# Patient Record
Sex: Male | Born: 1960
Health system: Southern US, Community
[De-identification: ages and names within clinical notes are randomized; demographics above are authoritative.]

## PROBLEM LIST (undated history)

## (undated) DIAGNOSIS — F1410 Cocaine abuse, uncomplicated: Secondary | ICD-10-CM

## (undated) DIAGNOSIS — I10 Essential (primary) hypertension: Secondary | ICD-10-CM

## (undated) DIAGNOSIS — F1010 Alcohol abuse, uncomplicated: Secondary | ICD-10-CM

---

## 2015-04-09 ENCOUNTER — Emergency Department (HOSPITAL_COMMUNITY): Payer: Medicaid Other

## 2015-04-09 ENCOUNTER — Inpatient Hospital Stay (HOSPITAL_COMMUNITY): Payer: Medicaid Other

## 2015-04-09 ENCOUNTER — Inpatient Hospital Stay (HOSPITAL_COMMUNITY)
Admission: EM | Admit: 2015-04-09 | Discharge: 2015-04-23 | DRG: 963 | Disposition: E | Payer: Medicaid Other | Attending: General Surgery | Admitting: General Surgery

## 2015-04-09 ENCOUNTER — Encounter (HOSPITAL_COMMUNITY): Payer: Self-pay | Admitting: Emergency Medicine

## 2015-04-09 DIAGNOSIS — S0219XB Other fracture of base of skull, initial encounter for open fracture: Secondary | ICD-10-CM | POA: Diagnosis present

## 2015-04-09 DIAGNOSIS — N179 Acute kidney failure, unspecified: Secondary | ICD-10-CM | POA: Diagnosis present

## 2015-04-09 DIAGNOSIS — S7292XB Unspecified fracture of left femur, initial encounter for open fracture type I or II: Secondary | ICD-10-CM | POA: Diagnosis present

## 2015-04-09 DIAGNOSIS — S069X9A Unspecified intracranial injury with loss of consciousness of unspecified duration, initial encounter: Secondary | ICD-10-CM | POA: Diagnosis present

## 2015-04-09 DIAGNOSIS — R402212 Coma scale, best verbal response, none, at arrival to emergency department: Secondary | ICD-10-CM | POA: Diagnosis present

## 2015-04-09 DIAGNOSIS — R402112 Coma scale, eyes open, never, at arrival to emergency department: Secondary | ICD-10-CM | POA: Diagnosis present

## 2015-04-09 DIAGNOSIS — R402312 Coma scale, best motor response, none, at arrival to emergency department: Secondary | ICD-10-CM | POA: Diagnosis present

## 2015-04-09 DIAGNOSIS — S069XAA Unspecified intracranial injury with loss of consciousness status unknown, initial encounter: Secondary | ICD-10-CM | POA: Diagnosis present

## 2015-04-09 DIAGNOSIS — S7291XB Unspecified fracture of right femur, initial encounter for open fracture type I or II: Secondary | ICD-10-CM | POA: Diagnosis present

## 2015-04-09 DIAGNOSIS — Z66 Do not resuscitate: Secondary | ICD-10-CM | POA: Diagnosis not present

## 2015-04-09 DIAGNOSIS — R001 Bradycardia, unspecified: Secondary | ICD-10-CM

## 2015-04-09 DIAGNOSIS — T1490XA Injury, unspecified, initial encounter: Secondary | ICD-10-CM

## 2015-04-09 DIAGNOSIS — I959 Hypotension, unspecified: Secondary | ICD-10-CM | POA: Diagnosis present

## 2015-04-09 DIAGNOSIS — S0291XB Unspecified fracture of skull, initial encounter for open fracture: Secondary | ICD-10-CM | POA: Diagnosis present

## 2015-04-09 DIAGNOSIS — J9600 Acute respiratory failure, unspecified whether with hypoxia or hypercapnia: Secondary | ICD-10-CM | POA: Diagnosis present

## 2015-04-09 DIAGNOSIS — S82201B Unspecified fracture of shaft of right tibia, initial encounter for open fracture type I or II: Secondary | ICD-10-CM | POA: Diagnosis present

## 2015-04-09 DIAGNOSIS — I10 Essential (primary) hypertension: Secondary | ICD-10-CM | POA: Diagnosis present

## 2015-04-09 DIAGNOSIS — S82401B Unspecified fracture of shaft of right fibula, initial encounter for open fracture type I or II: Secondary | ICD-10-CM

## 2015-04-09 DIAGNOSIS — S0292XA Unspecified fracture of facial bones, initial encounter for closed fracture: Secondary | ICD-10-CM | POA: Diagnosis present

## 2015-04-09 DIAGNOSIS — I4901 Ventricular fibrillation: Secondary | ICD-10-CM | POA: Diagnosis not present

## 2015-04-09 DIAGNOSIS — T07XXXA Unspecified multiple injuries, initial encounter: Secondary | ICD-10-CM

## 2015-04-09 DIAGNOSIS — S0181XA Laceration without foreign body of other part of head, initial encounter: Secondary | ICD-10-CM | POA: Diagnosis present

## 2015-04-09 HISTORY — DX: Cocaine abuse, uncomplicated: F14.10

## 2015-04-09 HISTORY — DX: Alcohol abuse, uncomplicated: F10.10

## 2015-04-09 HISTORY — DX: Essential (primary) hypertension: I10

## 2015-04-09 LAB — COMPREHENSIVE METABOLIC PANEL
ALT: 45 U/L (ref 17–63)
AST: 104 U/L — ABNORMAL HIGH (ref 15–41)
Albumin: 2.2 g/dL — ABNORMAL LOW (ref 3.5–5.0)
Alkaline Phosphatase: 64 U/L (ref 38–126)
Anion gap: 13 (ref 5–15)
BILIRUBIN TOTAL: 0.4 mg/dL (ref 0.3–1.2)
BUN: 20 mg/dL (ref 6–20)
CALCIUM: 7.4 mg/dL — AB (ref 8.9–10.3)
CO2: 19 mmol/L — ABNORMAL LOW (ref 22–32)
Chloride: 110 mmol/L (ref 101–111)
Creatinine, Ser: 1.62 mg/dL — ABNORMAL HIGH (ref 0.61–1.24)
GFR, EST AFRICAN AMERICAN: 54 mL/min — AB (ref >60)
GFR, EST NON AFRICAN AMERICAN: 47 mL/min — AB (ref >60)
GLUCOSE: 332 mg/dL — AB (ref 65–99)
Potassium: 4 mmol/L (ref 3.5–5.1)
Sodium: 142 mmol/L (ref 135–145)
TOTAL PROTEIN: 4 g/dL — AB (ref 6.5–8.1)

## 2015-04-09 LAB — CBC
HEMATOCRIT: 34.5 % — AB (ref 39.0–52.0)
Hemoglobin: 11.1 g/dL — ABNORMAL LOW (ref 13.0–17.0)
MCH: 30.7 pg (ref 26.0–34.0)
MCHC: 32.2 g/dL (ref 30.0–36.0)
MCV: 95.6 fL (ref 78.0–100.0)
Platelets: 154 10*3/uL (ref 150–400)
RBC: 3.61 MIL/uL — ABNORMAL LOW (ref 4.22–5.81)
RDW: 13.2 % (ref 11.5–15.5)
WBC: 8.2 10*3/uL (ref 4.0–10.5)

## 2015-04-09 LAB — I-STAT ARTERIAL BLOOD GAS, ED
ACID-BASE EXCESS: 2 mmol/L (ref 0.0–2.0)
Bicarbonate: 30.6 mEq/L — ABNORMAL HIGH (ref 20.0–24.0)
O2 SAT: 100 %
TCO2: 33 mmol/L (ref 0–100)
pCO2 arterial: 80.7 mmHg (ref 35.0–45.0)
pH, Arterial: 7.186 — CL (ref 7.350–7.450)
pO2, Arterial: 358 mmHg — ABNORMAL HIGH (ref 80.0–100.0)

## 2015-04-09 LAB — URINE MICROSCOPIC-ADD ON

## 2015-04-09 LAB — I-STAT CHEM 8, ED
BUN: 23 mg/dL — AB (ref 6–20)
CREATININE: 1.4 mg/dL — AB (ref 0.61–1.24)
Calcium, Ion: 1.01 mmol/L — ABNORMAL LOW (ref 1.12–1.23)
Chloride: 105 mmol/L (ref 101–111)
GLUCOSE: 304 mg/dL — AB (ref 65–99)
HCT: 33 % — ABNORMAL LOW (ref 39.0–52.0)
HEMOGLOBIN: 11.2 g/dL — AB (ref 13.0–17.0)
POTASSIUM: 3.7 mmol/L (ref 3.5–5.1)
Sodium: 141 mmol/L (ref 135–145)
TCO2: 21 mmol/L (ref 0–100)

## 2015-04-09 LAB — URINALYSIS, ROUTINE W REFLEX MICROSCOPIC
BILIRUBIN URINE: NEGATIVE
Glucose, UA: 250 mg/dL — AB
Ketones, ur: NEGATIVE mg/dL
LEUKOCYTES UA: NEGATIVE
NITRITE: NEGATIVE
PH: 7 (ref 5.0–8.0)
Protein, ur: >300 mg/dL — AB
SPECIFIC GRAVITY, URINE: 1.022 (ref 1.005–1.030)

## 2015-04-09 LAB — CBC WITH DIFFERENTIAL/PLATELET
Basophils Absolute: 0 10*3/uL (ref 0.0–0.1)
Basophils Relative: 0 %
Eosinophils Absolute: 0 10*3/uL (ref 0.0–0.7)
Eosinophils Relative: 0 %
HEMATOCRIT: 36.5 % — AB (ref 39.0–52.0)
Hemoglobin: 12 g/dL — ABNORMAL LOW (ref 13.0–17.0)
LYMPHS ABS: 0.4 10*3/uL — AB (ref 0.7–4.0)
LYMPHS PCT: 4 %
MCH: 29.5 pg (ref 26.0–34.0)
MCHC: 32.9 g/dL (ref 30.0–36.0)
MCV: 89.7 fL (ref 78.0–100.0)
MONO ABS: 0.9 10*3/uL (ref 0.1–1.0)
MONOS PCT: 8 %
NEUTROS ABS: 9.7 10*3/uL — AB (ref 1.7–7.7)
Neutrophils Relative %: 88 %
Platelets: 126 10*3/uL — ABNORMAL LOW (ref 150–400)
RBC: 4.07 MIL/uL — ABNORMAL LOW (ref 4.22–5.81)
RDW: 14.3 % (ref 11.5–15.5)
WBC: 11 10*3/uL — ABNORMAL HIGH (ref 4.0–10.5)

## 2015-04-09 LAB — POCT I-STAT 3, ART BLOOD GAS (G3+)
Acid-base deficit: 1 mmol/L (ref 0.0–2.0)
BICARBONATE: 26.4 meq/L — AB (ref 20.0–24.0)
O2 Saturation: 100 %
PO2 ART: 536 mmHg — AB (ref 80.0–100.0)
TCO2: 28 mmol/L (ref 0–100)
pCO2 arterial: 55.7 mmHg — ABNORMAL HIGH (ref 35.0–45.0)
pH, Arterial: 7.286 — ABNORMAL LOW (ref 7.350–7.450)

## 2015-04-09 LAB — BASIC METABOLIC PANEL
Anion gap: 8 (ref 5–15)
BUN: 21 mg/dL — ABNORMAL HIGH (ref 6–20)
CALCIUM: 7.3 mg/dL — AB (ref 8.9–10.3)
CO2: 27 mmol/L (ref 22–32)
CREATININE: 1.92 mg/dL — AB (ref 0.61–1.24)
Chloride: 110 mmol/L (ref 101–111)
GFR calc Af Amer: 44 mL/min — ABNORMAL LOW (ref >60)
GFR calc non Af Amer: 38 mL/min — ABNORMAL LOW (ref >60)
GLUCOSE: 156 mg/dL — AB (ref 65–99)
Potassium: 3.8 mmol/L (ref 3.5–5.1)
Sodium: 145 mmol/L (ref 135–145)

## 2015-04-09 LAB — MASSIVE TRANSFUSION PROTOCOL ORDER (BLOOD BANK NOTIFICATION)

## 2015-04-09 LAB — CDS SEROLOGY

## 2015-04-09 LAB — PREPARE PLATELET PHERESIS: Unit division: 0

## 2015-04-09 LAB — TRIGLYCERIDES: Triglycerides: 57 mg/dL (ref <150)

## 2015-04-09 LAB — PROTIME-INR
INR: 1.47 (ref 0.00–1.49)
PROTHROMBIN TIME: 17.9 s — AB (ref 11.6–15.2)

## 2015-04-09 LAB — GLUCOSE, CAPILLARY: Glucose-Capillary: 98 mg/dL (ref 65–99)

## 2015-04-09 LAB — ETHANOL: Alcohol, Ethyl (B): <5 mg/dL (ref <5)

## 2015-04-09 LAB — ABO/RH: ABO/RH(D): B NEG

## 2015-04-09 MED ORDER — ATROPINE SULFATE 1 MG/ML IJ SOLN
INTRAMUSCULAR | Status: AC | PRN
  Administered 2015-04-09: 1 mg via INTRAVENOUS

## 2015-04-09 MED ORDER — POTASSIUM CHLORIDE IN NACL 20-0.9 MEQ/L-% IV SOLN
INTRAVENOUS | Status: DC
  Administered 2015-04-09: 20:00:00 via INTRAVENOUS
  Filled 2015-04-09 (×3): qty 1000

## 2015-04-09 MED ORDER — CHLORHEXIDINE GLUCONATE 0.12% ORAL RINSE (MEDLINE KIT): 15.0000 mL | Freq: Two times a day (BID) | OROMUCOSAL | Status: DC

## 2015-04-09 MED ORDER — SODIUM CHLORIDE 0.9 % IV SOLN
Freq: Once | INTRAVENOUS | Status: AC
  Administered 2015-04-09: 15:00:00 via INTRAVENOUS

## 2015-04-09 MED ORDER — ONDANSETRON HCL 4 MG/2ML IJ SOLN: 4.0000 mg | Freq: Four times a day (QID) | INTRAMUSCULAR | Status: DC | PRN

## 2015-04-09 MED ORDER — ONDANSETRON HCL 4 MG PO TABS: 4.0000 mg | ORAL_TABLET | Freq: Four times a day (QID) | ORAL | Status: DC | PRN

## 2015-04-09 MED ORDER — TRANEXAMIC ACID 1000 MG/10ML IV SOLN
1000.0000 mg | Freq: Once | INTRAVENOUS | Status: AC
  Administered 2015-04-09: 1000 mg via INTRAVENOUS
  Filled 2015-04-09: qty 10

## 2015-04-09 MED ORDER — INSULIN ASPART 100 UNIT/ML ~~LOC~~ SOLN: 0.0000 [IU] | SUBCUTANEOUS | Status: DC

## 2015-04-09 MED ORDER — TRANEXAMIC ACID 1000 MG/10ML IV SOLN
1000.0000 mg | Freq: Once | INTRAVENOUS | Status: DC
  Filled 2015-04-09: qty 10

## 2015-04-09 MED ORDER — SODIUM BICARBONATE 8.4 % IV SOLN
INTRAVENOUS | Status: AC | PRN
  Administered 2015-04-09 (×2): 100 meq via INTRAVENOUS

## 2015-04-09 MED ORDER — CEFAZOLIN SODIUM-DEXTROSE 2-3 GM-% IV SOLR
INTRAVENOUS | Status: AC
  Administered 2015-04-09: 15:00:00
  Filled 2015-04-09: qty 50

## 2015-04-09 MED ORDER — PROPOFOL 1000 MG/100ML IV EMUL: 0.0000 ug/kg/min | INTRAVENOUS | Status: DC

## 2015-04-09 MED ORDER — ANTISEPTIC ORAL RINSE SOLUTION (CORINZ): 7.0000 mL | OROMUCOSAL | Status: DC

## 2015-04-09 MED ORDER — SODIUM CHLORIDE 0.9 % IV SOLN
Freq: Once | INTRAVENOUS | Status: AC
  Administered 2015-04-09 (×2): via INTRAVENOUS

## 2015-04-09 MED ORDER — PANTOPRAZOLE SODIUM 40 MG IV SOLR: 40.0000 mg | Freq: Every day | INTRAVENOUS | Status: DC

## 2015-04-09 MED ORDER — FENTANYL CITRATE (PF) 100 MCG/2ML IJ SOLN
100.0000 ug | INTRAMUSCULAR | Status: DC | PRN
  Administered 2015-04-09: 100 ug via INTRAVENOUS

## 2015-04-09 MED ORDER — PANTOPRAZOLE SODIUM 40 MG PO TBEC: 40.0000 mg | DELAYED_RELEASE_TABLET | Freq: Every day | ORAL | Status: DC

## 2015-04-09 MED ORDER — EPINEPHRINE HCL 1 MG/ML IJ SOLN
0.5000 ug/min | INTRAMUSCULAR | Status: DC
  Administered 2015-04-09: 5 ug/min via INTRAVENOUS
  Filled 2015-04-09: qty 4

## 2015-04-09 MED ORDER — EPINEPHRINE HCL 0.1 MG/ML IJ SOSY
PREFILLED_SYRINGE | INTRAMUSCULAR | Status: AC | PRN
  Administered 2015-04-09: 1 via INTRAVENOUS

## 2015-04-09 MED ORDER — IOHEXOL 350 MG/ML SOLN
100.0000 mL | Freq: Once | INTRAVENOUS | Status: AC | PRN
  Administered 2015-04-09: 100 mL via INTRAVENOUS

## 2015-04-09 MED ORDER — FENTANYL CITRATE (PF) 100 MCG/2ML IJ SOLN
100.0000 ug | INTRAMUSCULAR | Status: DC | PRN
  Filled 2015-04-09: qty 2

## 2015-04-09 NOTE — ED Notes (Addendum)
Unit #1 FFP completed vial level 1 infuser

## 2015-04-09 NOTE — ED Notes (Signed)
Pt uncle at bedside, updated by Dr. Lindie Spruce on plan of care.

## 2015-04-09 NOTE — ED Notes (Signed)
Pt noted to be bleeding from nares, ears, and left eye. Dr. Lindie Spruce aware and at bedside.

## 2015-04-09 NOTE — ED Notes (Signed)
Unit #3 FFP completed via level 1 infuser
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
## 2015-04-09 NOTE — ED Notes (Signed)
Unit #6 PRBC completed via rapid infusion

## 2015-04-09 NOTE — ED Notes (Addendum)
PATIENT BELONGINGS IN LOCK BOX #2 WITH SECURITY

## 2015-04-09 NOTE — Progress Notes (Signed)
Orthopedic Tech Progress Note Patient Details:  Ralph Green [birth year] [id number]  Ortho Devices Type of Ortho Device: Post (long leg) splint Ortho Device/Splint Location: bi-le full leg splints Ortho Device/Splint Interventions: Ordered, Application   Trinna Post 03/27/2015, 8:39 PM

## 2015-04-09 NOTE — ED Notes (Addendum)
Unit #1 of PRBC completed vial level  1 infuser

## 2015-04-09 NOTE — ED Notes (Signed)
Unit #2 PRBC given vial level one infuser

## 2015-04-09 NOTE — ED Notes (Addendum)
UNIT#4FFP COMPLETED VIA LEVEL 1 INFUSER

## 2015-04-09 NOTE — ED Notes (Addendum)
Unit #2 FFP given vial level 1 infuser.

## 2015-04-09 NOTE — ED Notes (Signed)
Pt noted to have increase in facial swelling, minimal bleeding noted from pt nares, and left eye. Unable to suction pt mouth due to swelling.

## 2015-04-09 NOTE — Progress Notes (Signed)
Patient became bradycardic and hypotensive. Dr. Lindie Spruce paged. Code called. Ralph Green

## 2015-04-09 NOTE — ED Notes (Signed)
One black plastic bag brought by [facility] [employer]. (bag not inventoried, sent with family)

## 2015-04-09 NOTE — ED Notes (Signed)
[id number] cds referral number

## 2015-04-09 NOTE — Progress Notes (Signed)
Orthopedic Tech Progress Note Patient Details:  Ralph Green 06-27-1960 [id number] Applied bilateral Hare traction splints to lower extremities.  Pulses, sensation and motion intact before and after splinting.  Capillary refill less than 2 seconds before and after splinting. Musculoskeletal Traction Type of Traction: Hare Traction Traction Location: Bilateral lower extremities.    Lesle Chris 04/17/2015, 5:39 PM

## 2015-04-09 NOTE — ED Notes (Addendum)
Pt transported to CT with Trauma, Rt, and primary RN

## 2015-04-09 NOTE — ED Notes (Addendum)
Unit #4 PRB completed vial level 1 infuser.

## 2015-04-09 NOTE — ED Notes (Signed)
Dr. Lindie Spruce aware of pt BP;

## 2015-04-09 NOTE — ED Notes (Addendum)
Ortho at bedside to apply traction
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
## 2015-04-09 NOTE — Progress Notes (Signed)
Patient coded, returned to hypotensive state, then became bradycardic, wide complex, then no palpable or discernable BP.  Patient made a DNR again by the sister.  We will be withdrawing all support.  Marta Lamas. Gae Bon, MD, FACS [phone] Trauma Surgeon

## 2015-04-09 NOTE — Progress Notes (Signed)
RT called for 3M04 for code blue page, RT placed end tidal CO2 on through bag mask valve ventilation during code. RT then placed pt back on ventilator settings when code ended.

## 2015-04-09 NOTE — ED Notes (Signed)
[facility] notified by Myrtha Mantis, RN

## 2015-04-09 NOTE — ED Notes (Signed)
Unit #5 PRBC completed via rapid infusion
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
## 2015-04-09 NOTE — Progress Notes (Signed)
   04/08/2015 1643  Clinical Encounter Type  Visited With Health care provider;Family  Visit Type Initial;Code;Trauma;ED  Referral From Nurse  Spiritual Encounters  Spiritual Needs Prayer;Emotional  Stress Factors  Family Stress Factors Major life changes   Chaplain responded to a request to meet with patient's family (uncle and aunt). Patient's mom and sister are on the way from [place], [place]. Chaplain met with them, offered support and prayer. Chaplain support available as needed.   Jeri Lager, Chaplain 04/04/2015 4:45 PM

## 2015-04-09 NOTE — ED Notes (Addendum)
Unit #3 PRB completed vial level 1 infuser

## 2015-04-09 NOTE — Code Documentation (Signed)
  Patient Name: Ralph Green   MRN: [id number]   Date of Birth/ Sex: 1960-12-24 , male      Admission Date: 04/16/2015  Attending Provider: Trauma Md, MD  Primary Diagnosis: <principal problem not specified>   Indication: Pt was in his usual state of health until this PM, when he was noted to be Vfib. Code blue was subsequently called.   At the time of arrival on scene, ACLS protocol was underway.  He received CPR and 2 shocks, subsequently went into PEA arrest, and then returned to normal sinus rhythm.   Technical Description:  - CPR performance duration:  10-12  minutes  - Was defibrillation or cardioversion used? Yes   - Was external pacer placed? No  - Was patient intubated pre/post CPR? Yes   Medications Administered: Y = Yes; Blank = No Amiodarone    Atropine    Calcium  x1  Epinephrine  x3  Lidocaine    Magnesium    Norepinephrine    Phenylephrine    Sodium bicarbonate  x2  Vasopressin     Post CPR evaluation:  - Final Status - Was patient successfully resuscitated ? Yes - What is current rhythm? NSR - What is current hemodynamic status? Stable  Miscellaneous Information:  - Labs sent, including: Code blue labs sent but then cancelled after code status change  - Primary team notified?  Yes  - Family Notified? Yes  - Additional notes/ transfer status: Patient's sister is HCPOA. She changed his code status to DNR. Chaplain was called.      John Giovanni, MD  04/02/2015, 10:43 PM
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
## 2015-04-10 LAB — TYPE AND SCREEN
ABO/RH(D): B NEG
Antibody Screen: NEGATIVE
UNIT DIVISION: 0
UNIT DIVISION: 0
UNIT DIVISION: 0
UNIT DIVISION: 0
Unit division: 0
Unit division: 0
Unit division: 0
Unit division: 0
Unit division: 0
Unit division: 0
Unit division: 0
Unit division: 0
Unit division: 0
Unit division: 0

## 2015-04-10 LAB — PREPARE FRESH FROZEN PLASMA
UNIT DIVISION: 0
UNIT DIVISION: 0
UNIT DIVISION: 0
UNIT DIVISION: 0
UNIT DIVISION: 0
UNIT DIVISION: 0
Unit division: 0
Unit division: 0
Unit division: 0
Unit division: 0
Unit division: 0
Unit division: 0

## 2015-04-10 MED FILL — Medication: Qty: 1 | Status: AC

## 2015-04-11 MED FILL — Medication: Qty: 1 | Status: AC

## 2015-04-18 DIAGNOSIS — S0291XB Unspecified fracture of skull, initial encounter for open fracture: Secondary | ICD-10-CM | POA: Diagnosis present

## 2015-04-18 DIAGNOSIS — S82201B Unspecified fracture of shaft of right tibia, initial encounter for open fracture type I or II: Secondary | ICD-10-CM | POA: Diagnosis present

## 2015-04-18 DIAGNOSIS — S82401B Unspecified fracture of shaft of right fibula, initial encounter for open fracture type I or II: Secondary | ICD-10-CM

## 2015-04-18 DIAGNOSIS — S7292XB Unspecified fracture of left femur, initial encounter for open fracture type I or II: Secondary | ICD-10-CM | POA: Diagnosis present

## 2015-04-18 DIAGNOSIS — S0181XA Laceration without foreign body of other part of head, initial encounter: Secondary | ICD-10-CM | POA: Diagnosis present

## 2015-04-18 DIAGNOSIS — S0292XA Unspecified fracture of facial bones, initial encounter for closed fracture: Secondary | ICD-10-CM | POA: Diagnosis present

## 2015-04-18 DIAGNOSIS — S7291XB Unspecified fracture of right femur, initial encounter for open fracture type I or II: Secondary | ICD-10-CM | POA: Diagnosis present

## 2015-04-18 DIAGNOSIS — J9600 Acute respiratory failure, unspecified whether with hypoxia or hypercapnia: Secondary | ICD-10-CM | POA: Diagnosis present

## 2015-04-23 NOTE — Progress Notes (Signed)
Patient became asystole with no palpable pulse. Two nurses pronounced patient Ralph Fraise RN & [place] Allen RN). Family at bedside. Ralph Green D
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
## 2015-04-23 DEATH — deceased
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
